# Patient Record
Sex: Male | Born: 2000 | Race: White | Hispanic: No | Marital: Single | Smoking: Never smoker
Health system: Southern US, Community
[De-identification: ages and names within clinical notes are randomized; demographics above are authoritative.]

---

## 2019-09-03 ENCOUNTER — Emergency Department (HOSPITAL_COMMUNITY): Payer: Medicaid Other

## 2019-09-03 ENCOUNTER — Other Ambulatory Visit: Payer: Self-pay

## 2019-09-03 ENCOUNTER — Emergency Department (HOSPITAL_COMMUNITY)
Admission: EM | Admit: 2019-09-03 | Discharge: 2019-09-03 | Disposition: A | Discharge status: Home / Self Care | Payer: Medicaid Other | Attending: Emergency Medicine | Admitting: Emergency Medicine

## 2019-09-03 ENCOUNTER — Encounter (HOSPITAL_COMMUNITY): Payer: Self-pay | Admitting: Emergency Medicine

## 2019-09-03 DIAGNOSIS — M79604 Pain in right leg: Secondary | ICD-10-CM | POA: Diagnosis present

## 2019-09-03 DIAGNOSIS — Y999 Unspecified external cause status: Secondary | ICD-10-CM | POA: Insufficient documentation

## 2019-09-03 DIAGNOSIS — Y93I9 Activity, other involving external motion: Secondary | ICD-10-CM | POA: Diagnosis not present

## 2019-09-03 DIAGNOSIS — Y92410 Unspecified street and highway as the place of occurrence of the external cause: Secondary | ICD-10-CM | POA: Diagnosis not present

## 2019-09-03 MED ORDER — MELOXICAM 7.5 MG PO TABS
7.5000 mg | ORAL_TABLET | Freq: Every day | ORAL | 0 refills | Status: AC
Start: 2019-09-03 — End: 2019-09-10

## 2019-09-03 MED ORDER — ACETAMINOPHEN 500 MG PO TABS
1000.0000 mg | ORAL_TABLET | Freq: Once | ORAL | Status: AC
  Administered 2019-09-03: 1000 mg via ORAL
  Filled 2019-09-03: qty 2

## 2019-09-03 MED ORDER — TIZANIDINE HCL 2 MG PO CAPS: 2.0000 mg | ORAL_CAPSULE | Freq: Three times a day (TID) | ORAL | 0 refills | Status: AC
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
## 2019-09-03 NOTE — Discharge Instructions (Addendum)
As discussed, your evaluation today has been largely reassuring.  But, it is important that you monitor your condition carefully, and do not hesitate to return to the ED if you develop new, or concerning changes in your condition. ? ?Otherwise, please follow-up with your physician for appropriate ongoing care. ? ?
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
## 2021-06-18 ENCOUNTER — Emergency Department (HOSPITAL_COMMUNITY)
Admission: EM | Admit: 2021-06-18 | Discharge: 2021-06-18 | Disposition: A | Discharge status: Home / Self Care | Payer: Medicaid Other | Attending: Emergency Medicine | Admitting: Emergency Medicine

## 2021-06-18 ENCOUNTER — Other Ambulatory Visit: Payer: Self-pay

## 2021-06-18 ENCOUNTER — Encounter (HOSPITAL_COMMUNITY): Payer: Self-pay | Admitting: Emergency Medicine

## 2021-06-18 DIAGNOSIS — R590 Localized enlarged lymph nodes: Secondary | ICD-10-CM | POA: Insufficient documentation

## 2021-06-18 DIAGNOSIS — J029 Acute pharyngitis, unspecified: Secondary | ICD-10-CM | POA: Diagnosis not present

## 2021-06-18 LAB — RPR: RPR Ser Ql: NONREACTIVE

## 2021-06-18 LAB — HEPATITIS PANEL, ACUTE
HCV Ab: NONREACTIVE
Hep A IgM: NONREACTIVE
Hep B C IgM: NONREACTIVE
Hepatitis B Surface Ag: NONREACTIVE

## 2021-06-18 LAB — HIV ANTIBODY (ROUTINE TESTING W REFLEX): HIV Screen 4th Generation wRfx: NONREACTIVE

## 2021-06-18 LAB — GROUP A STREP BY PCR: Group A Strep by PCR: NOT DETECTED

## 2021-06-18 MED ORDER — DEXAMETHASONE SODIUM PHOSPHATE 10 MG/ML IJ SOLN
10.0000 mg | Freq: Once | INTRAMUSCULAR | Status: AC
  Administered 2021-06-18: 10 mg via INTRAVENOUS
  Filled 2021-06-18: qty 1

## 2021-06-18 MED ORDER — SODIUM CHLORIDE 0.9 % IV BOLUS
1000.0000 mL | Freq: Once | INTRAVENOUS | Status: AC
  Administered 2021-06-18: 1000 mL via INTRAVENOUS

## 2021-06-18 MED ORDER — CLINDAMYCIN HCL 300 MG PO CAPS: 300.0000 mg | ORAL_CAPSULE | Freq: Three times a day (TID) | ORAL | 0 refills | Status: AC

## 2021-06-18 MED ORDER — KETOROLAC TROMETHAMINE 15 MG/ML IJ SOLN
15.0000 mg | Freq: Once | INTRAMUSCULAR | Status: AC
  Administered 2021-06-18: 15 mg via INTRAVENOUS
  Filled 2021-06-18: qty 1

## 2021-06-18 MED ORDER — CLINDAMYCIN PHOSPHATE 600 MG/50ML IV SOLN
600.0000 mg | Freq: Once | INTRAVENOUS | Status: AC
  Administered 2021-06-18: 600 mg via INTRAVENOUS
  Filled 2021-06-18: qty 50

## 2021-06-18 NOTE — ED Triage Notes (Signed)
Patient here for evaluation of tonsillitis that started one week ago, seen earlier this week at Urgent Care for same. Patient specifically requests to have his tonsils removed, states he went to [facility] ED earlier today and requested same. Patient alert, oriented, speaking in complete sentences.

## 2021-06-18 NOTE — ED Notes (Signed)
Pt verbalizes understanding of discharge instructions. Opportunity for questions and answers were provided. Pt discharged from the ED.   ?

## 2021-06-18 NOTE — ED Provider Notes (Signed)
[facility] EMERGENCY DEPARTMENT Provider Note   CSN: [id number] Arrival date & time: 06/18/21  0539     History  Chief Complaint  Patient presents with   Sore Throat    Monish Haliburton is a 21 y.o. male.  Patient here for sore throat.  States that he had symptoms for several days.  Was started on antibiotic yesterday.  States that he was negative for strep and COVID.  Does not know the name of the antibiotic he is supposed to be on.  He is taking antibiotics for 1 day.  Still has discomfort.  No trismus.  The history is provided by the patient.  Sore Throat This is a new problem. The current episode started more than 2 days ago. The problem occurs daily. The problem has not changed since onset.Pertinent negatives include no chest pain, no abdominal pain, no headaches and no shortness of breath. Nothing aggravates the symptoms. Nothing relieves the symptoms. He has tried nothing for the symptoms. The treatment provided no relief.      Home Medications Prior to Admission medications   Medication Sig Start Date End Date Taking? Authorizing Provider  clindamycin (CLEOCIN) 300 MG capsule Take 1 capsule (300 mg total) by mouth 3 (three) times daily for 10 days. 06/18/21 06/28/21 Yes Orrin Yurkovich, DO      Allergies    Patient has no allergy information on record.    Review of Systems   Review of Systems  Respiratory:  Negative for shortness of breath.   Cardiovascular:  Negative for chest pain.  Gastrointestinal:  Negative for abdominal pain.  Neurological:  Negative for headaches.   Physical Exam Updated Vital Signs BP 122/73    Pulse (!) 51    Temp 98.7 F (37.1 C) (Oral)    Resp 16    SpO2 96%  Physical Exam Vitals and nursing note reviewed.  Constitutional:      General: He is not in acute distress.    Appearance: He is well-developed. He is not ill-appearing.  HENT:     Head: Normocephalic and atraumatic.     Nose: No congestion.     Mouth/Throat:      Mouth: Mucous membranes are moist. No oral lesions.     Pharynx: Uvula midline. Oropharyngeal exudate and posterior oropharyngeal erythema present. No pharyngeal swelling or uvula swelling.     Tonsils: Tonsillar exudate present. No tonsillar abscesses. 1+ on the right. 0 on the left.  Eyes:     Conjunctiva/sclera: Conjunctivae normal.  Cardiovascular:     Rate and Rhythm: Normal rate and regular rhythm.     Heart sounds: No murmur heard. Pulmonary:     Effort: Pulmonary effort is normal. No respiratory distress.     Breath sounds: Normal breath sounds.  Abdominal:     Palpations: Abdomen is soft.     Tenderness: There is no abdominal tenderness.  Musculoskeletal:        General: No swelling.     Cervical back: Neck supple.  Skin:    General: Skin is warm and dry.     Capillary Refill: Capillary refill takes less than 2 seconds.  Neurological:     Mental Status: He is alert.  Psychiatric:        Mood and Affect: Mood normal.    ED Results / Procedures / Treatments   Labs (all labs ordered are listed, but only abnormal results are displayed) Labs Reviewed  GROUP A STREP BY PCR  RPR  HIV ANTIBODY (ROUTINE TESTING W REFLEX)  HEPATITIS PANEL, ACUTE  GC/CHLAMYDIA PROBE AMP ([place]) NOT AT [facility]    EKG None  Radiology No results found.  Procedures Procedures    Medications Ordered in ED Medications  clindamycin (CLEOCIN) IVPB 600 mg (0 mg Intravenous Stopped 06/18/21 1123)  dexamethasone (DECADRON) injection 10 mg (10 mg Intravenous Given 06/18/21 1032)  ketorolac (TORADOL) 15 MG/ML injection 15 mg (15 mg Intravenous Given 06/18/21 1033)  sodium chloride 0.9 % bolus 1,000 mL (0 mLs Intravenous Stopped 06/18/21 1123)    ED Course/ Medical Decision Making/ A&P                           Medical Decision Making Amount and/or Complexity of Data Reviewed Labs: ordered.  Risk Prescription drug management.   Ugochukwu Chichester is here for sore throat.   Unremarkable vitals.  No fever.  States he has been on antibiotic for 1 day.  Was seen yesterday at another emergency department.  Started on antibiotics but he does not know the name.  He appears to have exudate on his right tonsil.  No abscess.  No trismus or drooling.  There is no swelling of the tongue or submandibular space.  He has some right cervical adenopathy.  Oropharynx is otherwise unremarkable.  He states he had a negative strep and COVID test yesterday.  He is fairly uncomfortable.  Overall my suspicion is that this is likely viral pharyngitis.  But we will recheck him for strep, swab him for gonorrhea as well as tested for HIV, hepatitis, syphilis per his request.  We will give him a dose IV clindamycin, IV Decadron, IV Toradol and fluid bolus.  I have no concern for peritonsillar abscess or tonsillar abscess.  Patient feeling better after IV fluids, steroids and Toradol.  Tolerated clindamycin well.  Strep test continues to be negative.  STD testing has been sent including swab of his throat to test for gonorrhea and chlamydia.  He will follow-up these results.  Understands return precautions.  We will have him follow-up with ENT.  At this time I have no concern for abscess.  Patient did repeatedly ask me to have his tonsils taken out and tried to talk to him about how that is not clinically indicated.  I have talked to his family about this as well.  They can talk about if that would be needed with ENT outpatient.  This chart was dictated using voice recognition software.  Despite best efforts to proofread,  errors can occur which can change the documentation meaning.         Final Clinical Impression(s) / ED Diagnoses Final diagnoses:  Pharyngitis, unspecified etiology    Rx / DC Orders ED Discharge Orders          Ordered    clindamycin (CLEOCIN) 300 MG capsule  3 times daily        06/18/21 1202              Virgina Norfolk, DO 06/18/21 1206

## 2021-06-18 NOTE — Discharge Instructions (Signed)
Take antibiotic as prescribed.  Recommend 1000 mg of Tylenol every 6 hours as needed for pain.  Recommend 800 mg ibuprofen every 8 hours as needed for pain.  Please return if symptoms worsen.  Follow-up your STD testing on your MyChart.  If you are positive for gonorrhea or chlamydia please return for evaluation but you should be geting phone call about that as well.

## 2021-06-19 LAB — GC/CHLAMYDIA PROBE AMP (~~LOC~~) NOT AT ARMC
Chlamydia: NEGATIVE
Comment: NEGATIVE
Comment: NORMAL
Neisseria Gonorrhea: NEGATIVE

## 2021-07-23 ENCOUNTER — Emergency Department (HOSPITAL_COMMUNITY)
Admission: EM | Admit: 2021-07-23 | Discharge: 2021-07-23 | Disposition: A | Discharge status: Home / Self Care | Payer: Medicaid Other | Attending: Emergency Medicine | Admitting: Emergency Medicine

## 2021-07-23 ENCOUNTER — Emergency Department (HOSPITAL_COMMUNITY): Payer: Medicaid Other

## 2021-07-23 ENCOUNTER — Other Ambulatory Visit: Payer: Self-pay

## 2021-07-23 DIAGNOSIS — S0083XA Contusion of other part of head, initial encounter: Secondary | ICD-10-CM | POA: Insufficient documentation

## 2021-07-23 DIAGNOSIS — H9202 Otalgia, left ear: Secondary | ICD-10-CM | POA: Insufficient documentation

## 2021-07-23 DIAGNOSIS — S0993XA Unspecified injury of face, initial encounter: Secondary | ICD-10-CM | POA: Diagnosis present

## 2021-07-23 MED ORDER — MELOXICAM 7.5 MG PO TABS: 7.5000 mg | ORAL_TABLET | Freq: Every day | ORAL | 0 refills | Status: AC
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
## 2021-07-23 NOTE — Discharge Instructions (Addendum)
Fortunately the CT of your facial bones does not show any fractures today.  Please take the prescribed anti-inflammatory as directed.  If you continue to have significant pain in 1 week or difficulty with chewing, please follow-up with your doctor or the ENT specialist listed. ?
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
## 2022-03-06 ENCOUNTER — Other Ambulatory Visit: Payer: Self-pay

## 2022-03-06 ENCOUNTER — Encounter (HOSPITAL_BASED_OUTPATIENT_CLINIC_OR_DEPARTMENT_OTHER): Payer: Self-pay

## 2022-03-06 ENCOUNTER — Emergency Department (HOSPITAL_BASED_OUTPATIENT_CLINIC_OR_DEPARTMENT_OTHER)
Admission: EM | Admit: 2022-03-06 | Discharge: 2022-03-06 | Discharge status: Against Medical Advice | Payer: Medicaid Other | Attending: Emergency Medicine | Admitting: Emergency Medicine

## 2022-03-06 DIAGNOSIS — Z5321 Procedure and treatment not carried out due to patient leaving prior to being seen by health care provider: Secondary | ICD-10-CM | POA: Insufficient documentation

## 2022-03-06 DIAGNOSIS — L0231 Cutaneous abscess of buttock: Secondary | ICD-10-CM | POA: Diagnosis present

## 2022-03-06 NOTE — ED Triage Notes (Signed)
Pt states he has an abscess on his buttocks that he noticed at 1600 today.

## 2022-03-06 NOTE — ED Notes (Signed)
Pt demanded registration that he speak to someone about going back to a room immediately.  Triage spoke to pt.  Pt told registration and states "I am going home, I don't have time to wait"

## 2022-03-24 ENCOUNTER — Encounter (HOSPITAL_COMMUNITY): Payer: Self-pay

## 2022-03-24 ENCOUNTER — Other Ambulatory Visit: Payer: Self-pay

## 2022-03-24 ENCOUNTER — Emergency Department (HOSPITAL_COMMUNITY)
Admission: EM | Admit: 2022-03-24 | Discharge: 2022-03-24 | Disposition: A | Discharge status: Home / Self Care | Payer: Medicaid Other | Attending: Emergency Medicine | Admitting: Emergency Medicine

## 2022-03-24 DIAGNOSIS — K649 Unspecified hemorrhoids: Secondary | ICD-10-CM | POA: Diagnosis not present

## 2022-03-24 DIAGNOSIS — K6289 Other specified diseases of anus and rectum: Secondary | ICD-10-CM | POA: Diagnosis present

## 2022-03-24 DIAGNOSIS — R1084 Generalized abdominal pain: Secondary | ICD-10-CM

## 2022-03-24 LAB — COMPREHENSIVE METABOLIC PANEL
ALT: 28 U/L (ref 0–44)
AST: 27 U/L (ref 15–41)
Albumin: 4.1 g/dL (ref 3.5–5.0)
Alkaline Phosphatase: 62 U/L (ref 38–126)
Anion gap: 6 (ref 5–15)
BUN: 16 mg/dL (ref 6–20)
CO2: 30 mmol/L (ref 22–32)
Calcium: 9.3 mg/dL (ref 8.9–10.3)
Chloride: 102 mmol/L (ref 98–111)
Creatinine, Ser: 0.86 mg/dL (ref 0.61–1.24)
GFR, Estimated: >60 mL/min (ref >60)
Glucose, Bld: 95 mg/dL (ref 70–99)
Potassium: 4.5 mmol/L (ref 3.5–5.1)
Sodium: 138 mmol/L (ref 135–145)
Total Bilirubin: 0.9 mg/dL (ref 0.3–1.2)
Total Protein: 7.5 g/dL (ref 6.5–8.1)

## 2022-03-24 LAB — CBC
HCT: 44.7 % (ref 39.0–52.0)
Hemoglobin: 15 g/dL (ref 13.0–17.0)
MCH: 29.5 pg (ref 26.0–34.0)
MCHC: 33.6 g/dL (ref 30.0–36.0)
MCV: 88 fL (ref 80.0–100.0)
Platelets: 198 10*3/uL (ref 150–400)
RBC: 5.08 MIL/uL (ref 4.22–5.81)
RDW: 12.4 % (ref 11.5–15.5)
WBC: 7.1 10*3/uL (ref 4.0–10.5)
nRBC: 0 % (ref 0.0–0.2)

## 2022-03-24 LAB — LIPASE, BLOOD: Lipase: 30 U/L (ref 11–51)

## 2022-03-24 MED ORDER — LIDOCAINE-PRILOCAINE 2.5-2.5 % EX CREA: 1.0000 | TOPICAL_CREAM | CUTANEOUS | 0 refills | Status: AC | PRN

## 2022-03-24 MED ORDER — POLYETHYLENE GLYCOL 3350 17 GM/SCOOP PO POWD: 1.0000 | Freq: Once | ORAL | 0 refills | Status: AC

## 2022-03-24 NOTE — ED Provider Notes (Signed)
[facility] DEPT Provider Note   CSN: [id number] Arrival date & time: 03/24/22  [id number]     History Chief Complaint  Patient presents with   Abdominal Pain    HPI Tony Griffith is a 21 y.o. male presenting for chief complaint of rectal pain. He states that he was seen in urgent care last week diagnosed with hemorrhoids and started on some creams.  He states that the hemorrhoid happened when he bent over to drill a piece of metal.  He states that it hurts when he defecates and that he has been straining to have bowel movements.  He denies fevers or chills, nausea vomiting, syncope shortness of breath.  He is otherwise ambulatory tolerating p.o. intake.  He says that he took 1 Dulcolax earlier this week and it is already starting to improve.  However he is concerned because the symptoms have been persistent wants make sure that he does not need "surgery".  Patient's recorded medical, surgical, social, medication list and allergies were reviewed in the Snapshot window as part of the initial history.   Review of Systems   Review of Systems  Constitutional:  Negative for chills and fever.  HENT:  Negative for ear pain and sore throat.   Eyes:  Negative for pain and visual disturbance.  Respiratory:  Negative for cough and shortness of breath.   Cardiovascular:  Negative for chest pain and palpitations.  Gastrointestinal:  Positive for rectal pain. Negative for abdominal pain and vomiting.  Genitourinary:  Negative for dysuria and hematuria.  Musculoskeletal:  Negative for arthralgias and back pain.  Skin:  Negative for color change and rash.  Neurological:  Negative for seizures and syncope.  All other systems reviewed and are negative.   Physical Exam Updated Vital Signs BP 110/65 (BP Location: Left Arm)   Pulse 74   Temp 98.1 F (36.7 C) (Oral)   Resp 16   Ht 6\' 1"  (1.854 m)   Wt 78.2 kg   SpO2 98%   BMI 22.75 kg/m  Physical Exam Vitals and  nursing note reviewed.  Constitutional:      Appearance: He is well-developed.  HENT:     Head: Normocephalic and atraumatic.     Nose: No congestion or rhinorrhea.     Mouth/Throat:     Mouth: Mucous membranes are moist.     Pharynx: Oropharynx is clear. No oropharyngeal exudate.  Eyes:     Conjunctiva/sclera: Conjunctivae normal.     Pupils: Pupils are equal, round, and reactive to light.  Cardiovascular:     Rate and Rhythm: Normal rate and regular rhythm.     Heart sounds: No murmur heard. Pulmonary:     Effort: Pulmonary effort is normal. No respiratory distress.     Breath sounds: Normal breath sounds.  Abdominal:     Palpations: Abdomen is soft.     Tenderness: There is no abdominal tenderness.  Genitourinary:    Comments: Patient declined a rectal exam.  He stated that he already had 1 this week and unless I was doing surgery, he did not want to take his pants off again. Musculoskeletal:        General: No swelling, tenderness, deformity or signs of injury. Normal range of motion.     Cervical back: Neck supple. No rigidity or tenderness.  Skin:    General: Skin is warm and dry.  Neurological:     General: No focal deficit present.     Mental Status: He is  alert and oriented to person, place, and time. Mental status is at baseline.     Cranial Nerves: No cranial nerve deficit.     Motor: No weakness.      ED Course/ Medical Decision Making/ A&P    Procedures Procedures   Medications Ordered in ED Medications - No data to display  Medical Decision Making:    Tony Griffith is a 21 y.o. male who presented to the ED today with rectal pain detailed above.   Complete initial physical exam performed, notably the patient  was hemodynamically stable in no acute distress.  Unfortunately, he is not allowing a repeat rectal exam.     Reviewed and confirmed nursing documentation for past medical history, family history, social history.    Initial Assessment:    Patient's history present on his physicals and findings are most consistent with external hemorrhoid based on his description.  He is not allowing evaluation for thrombosed external hemorrhoid.  Regardless, given the improvement, clinically this does not seem consistent with to be a thrombosed hemorrhoid. Discussed medical management of this condition with the patient.  He would like to proceed with MiraLAX 3 times daily, topical creams and surgical referral for outpatient evaluation.  Given his well appearance, I believe this is reasonable.  Referral placed and patient stable for outpatient care and management at this time.  Disposition:  I have considered need for hospitalization, however, considering all of the above, I believe this patient is stable for discharge at this time.  Patient/family educated about specific return precautions for given chief complaint and symptoms.  Patient/family educated about follow-up with PCP/gen surg.     Patient/family expressed understanding of return precautions and need for follow-up. Patient spoken to regarding all imaging and laboratory results and appropriate follow up for these results. All education provided in verbal form with additional information in written form. Time was allowed for answering of patient questions. Patient discharged.    Emergency Department Medication Summary:   Medications - No data to display       Clinical Impression:  1. Generalized abdominal pain   2. Hemorrhoids, unspecified hemorrhoid type      Discharge   Final Clinical Impression(s) / ED Diagnoses Final diagnoses:  Generalized abdominal pain  Hemorrhoids, unspecified hemorrhoid type    Rx / DC Orders ED Discharge Orders          Ordered    polyethylene glycol powder (GLYCOLAX/MIRALAX) 17 GM/SCOOP powder   Once        03/24/22 1125    lidocaine-prilocaine (EMLA) cream  As needed        03/24/22 1125              Glyn Ade, MD 03/24/22  1504

## 2022-03-24 NOTE — Discharge Instructions (Addendum)
You are seen today for rectal pain.  You have a hemorrhoid.  You are going to need to follow-up with a general surgeon if you continue to have symptoms.  The most important treatment is to initiate MiraLAX 3 times daily for the next 5 to 7 days.  In the meantime, follow-up with either your primary doctor or your make an appointment with a general surgeon to be evaluated for need for operative treatment if your hemorrhoid continues to cause severe discomfort.  I prescribed a lidocaine cream which will help with the pain as needed.  Use it after a bowel movement.

## 2022-03-24 NOTE — ED Triage Notes (Signed)
Patient said his stomach hurts for a couple weeks but pain worsened at 8am today. No vomiting or diarrhea.

## 2022-08-18 IMAGING — CT CT MAXILLOFACIAL W/O CM
3 of 6 series · 15 of 47 positions shown, 18 images · non-contrast
Comparison: None.

CLINICAL DATA: Facial trauma, blunt mandible fracture.



[Series 3: maxilllofacial 2.0 hr40 3 · axial · 0.31mm/px · z∈[-237,-93]mm · 10 of 84 slices shown, 13 images]
[im 6/84  brain]
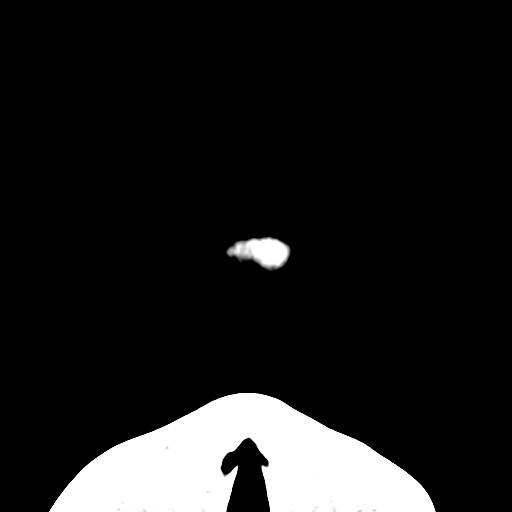
[im 6/84  bone]
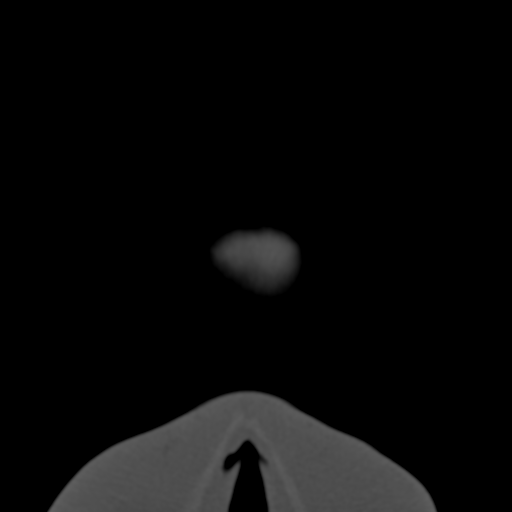
[im 12/84  bone]
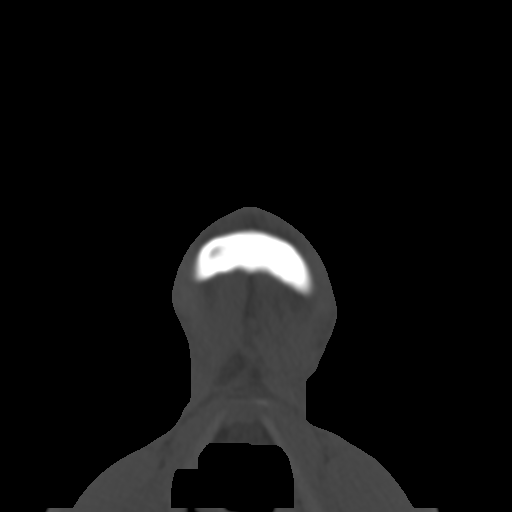
[im 24/84  bone]
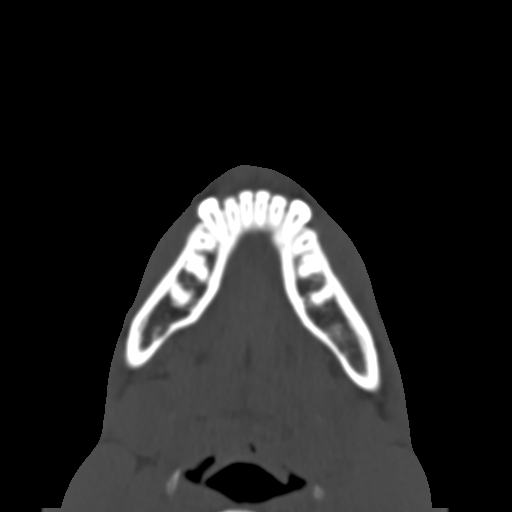
[im 30/84  bone]
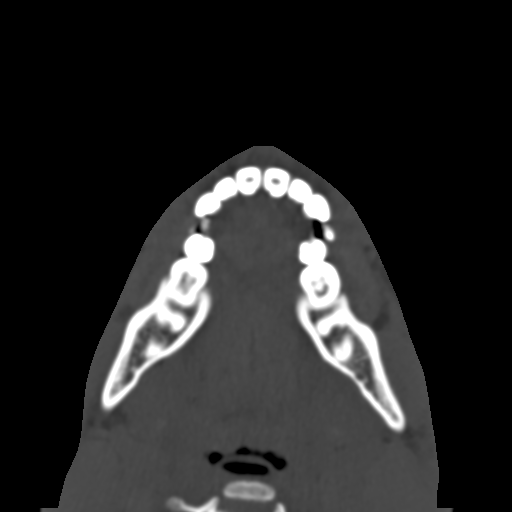
[im 36/84  brain]
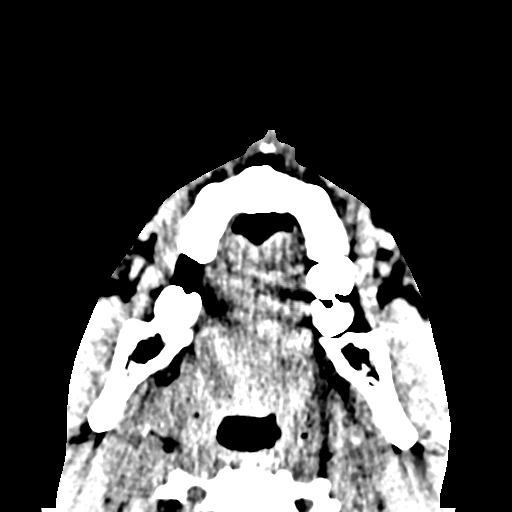
[im 36/84  bone]
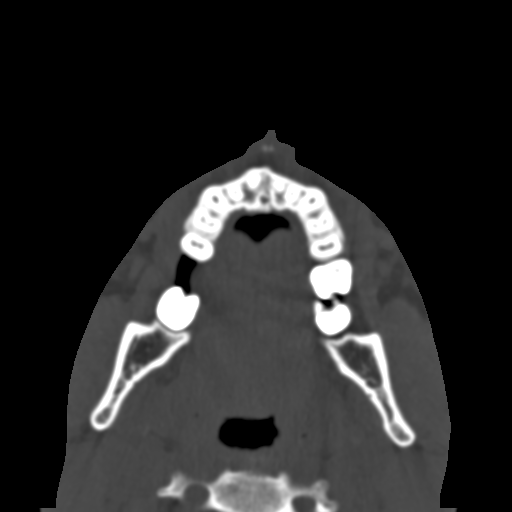
[im 48/84  bone]
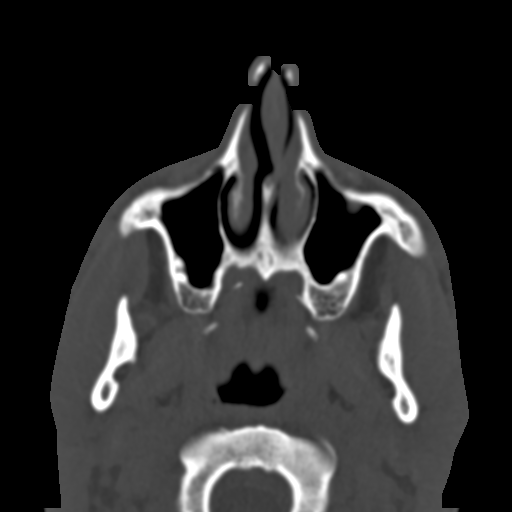
[im 54/84  bone]
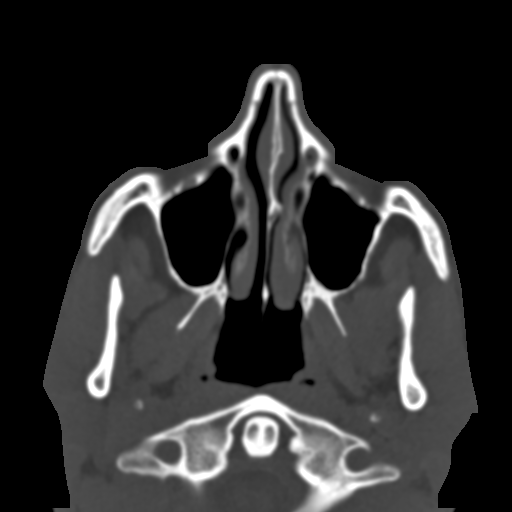
[im 60/84  bone]
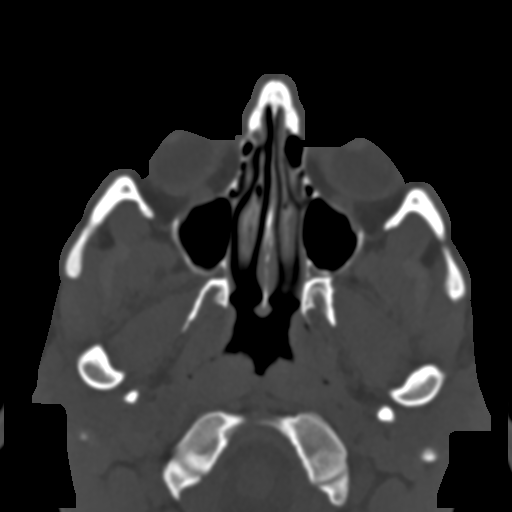
[im 72/84  brain]
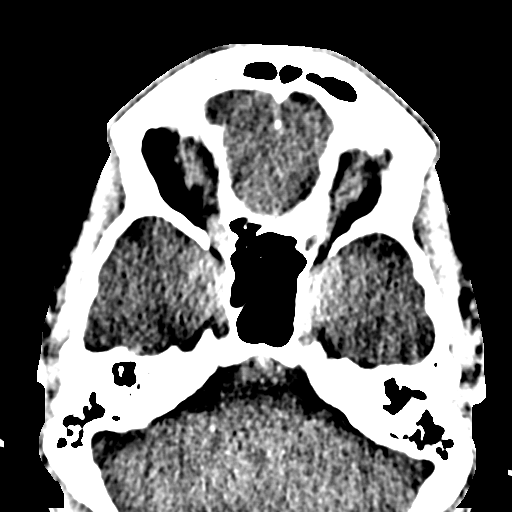
[im 72/84  bone]
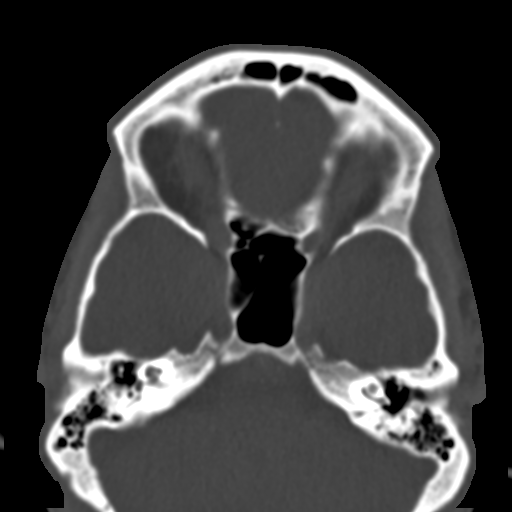
[im 78/84  bone]
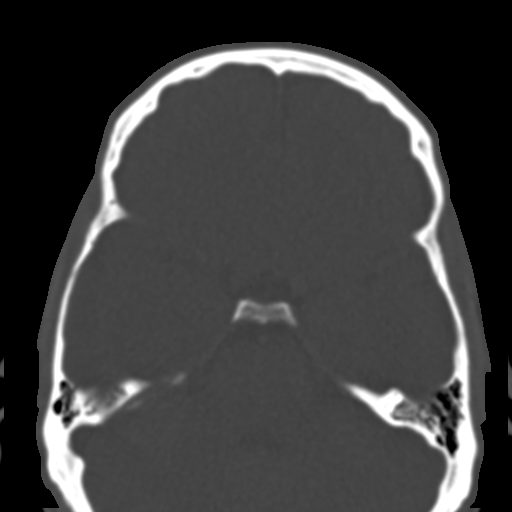

[Series 7: st cor · coronal · 0.33mm/px · 3 of 123 slices shown]
[im 31/123  bone]
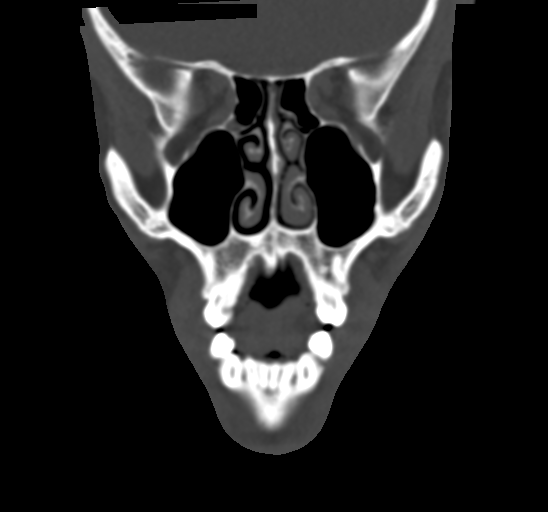
[im 62/123  bone]
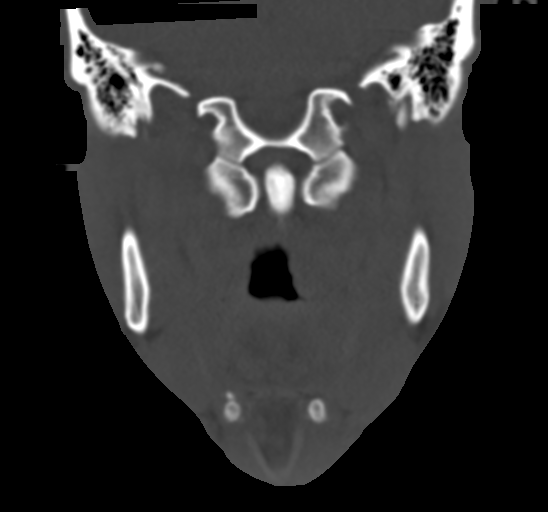
[im 92/123  bone]
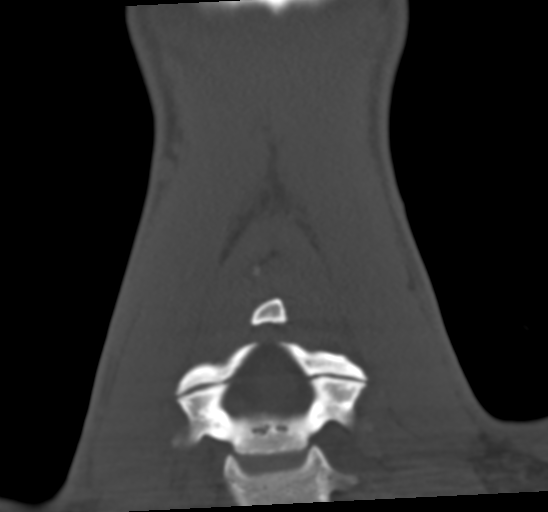

[Series 10: bone sag · sagittal · 0.33mm/px · 2 of 91 slices shown]
[im 31/91  bone]
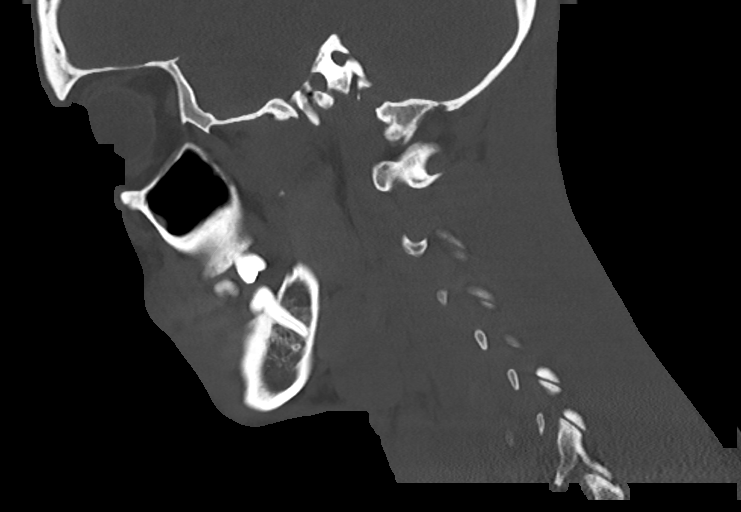
[im 61/91  bone]
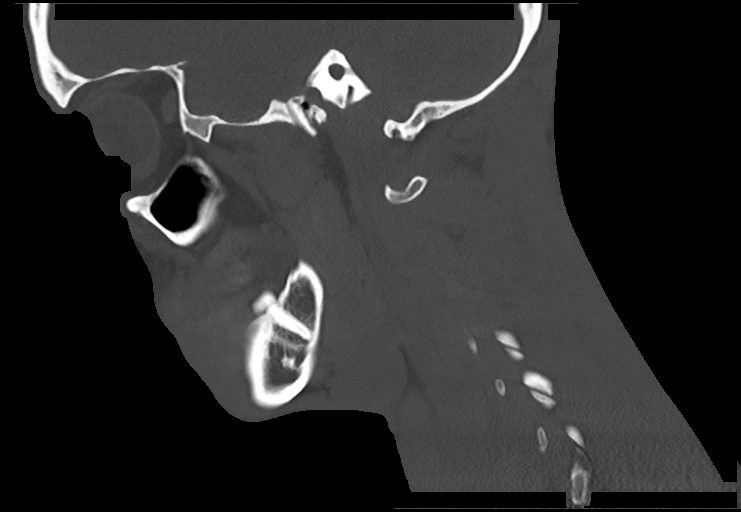

[15 of 47 positions shown; findings below may reference images not displayed]

FINDINGS: Osseous: No fracture or mandibular dislocation. No destructive
process.

Orbits: Negative. No traumatic or inflammatory finding.

Sinuses: A small round density is present in the left maxillary
sinus, likely mucosal retention cyst. No air-fluid levels are seen.

Soft tissues: No significant soft tissue hematoma.

Limited intracranial: No significant or unexpected finding.
IMPRESSION: No evidence of acute mandibular fracture or dislocation.

## 2023-11-30 ENCOUNTER — Emergency Department (HOSPITAL_COMMUNITY)
Admission: EM | Admit: 2023-11-30 | Discharge: 2023-11-30 | Disposition: A | Discharge status: Home / Self Care | Payer: Self-pay | Attending: Emergency Medicine | Admitting: Emergency Medicine

## 2023-11-30 ENCOUNTER — Emergency Department (HOSPITAL_COMMUNITY): Payer: Self-pay

## 2023-11-30 ENCOUNTER — Other Ambulatory Visit: Payer: Self-pay

## 2023-11-30 ENCOUNTER — Encounter (HOSPITAL_COMMUNITY): Payer: Self-pay

## 2023-11-30 DIAGNOSIS — R110 Nausea: Secondary | ICD-10-CM | POA: Diagnosis not present

## 2023-11-30 DIAGNOSIS — R519 Headache, unspecified: Secondary | ICD-10-CM | POA: Diagnosis present

## 2023-11-30 DIAGNOSIS — R1084 Generalized abdominal pain: Secondary | ICD-10-CM | POA: Diagnosis not present

## 2023-11-30 DIAGNOSIS — R197 Diarrhea, unspecified: Secondary | ICD-10-CM | POA: Insufficient documentation

## 2023-11-30 LAB — CBC WITH DIFFERENTIAL/PLATELET
Abs Immature Granulocytes: 0.01 K/uL (ref 0.00–0.07)
Basophils Absolute: 0 K/uL (ref 0.0–0.1)
Basophils Relative: 0 %
Eosinophils Absolute: 0 K/uL (ref 0.0–0.5)
Eosinophils Relative: 0 %
HCT: 42.3 % (ref 39.0–52.0)
Hemoglobin: 14.6 g/dL (ref 13.0–17.0)
Immature Granulocytes: 0 %
Lymphocytes Relative: 18 %
Lymphs Abs: 0.9 K/uL (ref 0.7–4.0)
MCH: 28.8 pg (ref 26.0–34.0)
MCHC: 34.5 g/dL (ref 30.0–36.0)
MCV: 83.4 fL (ref 80.0–100.0)
Monocytes Absolute: 0.7 K/uL (ref 0.1–1.0)
Monocytes Relative: 14 %
Neutro Abs: 3.3 K/uL (ref 1.7–7.7)
Neutrophils Relative %: 68 %
Platelets: 137 K/uL — ABNORMAL LOW (ref 150–400)
RBC: 5.07 MIL/uL (ref 4.22–5.81)
RDW: 12.5 % (ref 11.5–15.5)
WBC: 5 K/uL (ref 4.0–10.5)
nRBC: 0 % (ref 0.0–0.2)

## 2023-11-30 LAB — COMPREHENSIVE METABOLIC PANEL WITH GFR
ALT: 18 U/L (ref 0–44)
AST: 24 U/L (ref 15–41)
Albumin: 3.6 g/dL (ref 3.5–5.0)
Alkaline Phosphatase: 49 U/L (ref 38–126)
Anion gap: 11 (ref 5–15)
BUN: 15 mg/dL (ref 6–20)
CO2: 24 mmol/L (ref 22–32)
Calcium: 8.7 mg/dL — ABNORMAL LOW (ref 8.9–10.3)
Chloride: 102 mmol/L (ref 98–111)
Creatinine, Ser: 0.73 mg/dL (ref 0.61–1.24)
GFR, Estimated: >60 mL/min (ref >60)
Glucose, Bld: 114 mg/dL — ABNORMAL HIGH (ref 70–99)
Potassium: 4 mmol/L (ref 3.5–5.1)
Sodium: 137 mmol/L (ref 135–145)
Total Bilirubin: 0.8 mg/dL (ref 0.0–1.2)
Total Protein: 6.7 g/dL (ref 6.5–8.1)

## 2023-11-30 LAB — LIPASE, BLOOD: Lipase: 38 U/L (ref 11–51)

## 2023-11-30 MED ORDER — ONDANSETRON 4 MG PO TBDP: 4.0000 mg | ORAL_TABLET | Freq: Three times a day (TID) | ORAL | 0 refills | Status: AC | PRN

## 2023-11-30 MED ORDER — DIPHENHYDRAMINE HCL 50 MG/ML IJ SOLN
25.0000 mg | Freq: Once | INTRAMUSCULAR | Status: AC
  Administered 2023-11-30: 25 mg via INTRAVENOUS
  Filled 2023-11-30: qty 1

## 2023-11-30 MED ORDER — KETOROLAC TROMETHAMINE 15 MG/ML IJ SOLN
15.0000 mg | Freq: Once | INTRAMUSCULAR | Status: AC
  Administered 2023-11-30: 15 mg via INTRAVENOUS
  Filled 2023-11-30: qty 1

## 2023-11-30 MED ORDER — METOCLOPRAMIDE HCL 5 MG/ML IJ SOLN
10.0000 mg | Freq: Once | INTRAMUSCULAR | Status: AC
  Administered 2023-11-30: 10 mg via INTRAVENOUS
  Filled 2023-11-30: qty 2

## 2023-11-30 NOTE — ED Provider Notes (Signed)
 [place] EMERGENCY DEPARTMENT AT [facility] Provider Note   CSN: [id number] Arrival date & time: 11/30/23  0148     Patient presents with: Headache   Tony Griffith is a 23 y.o. male.   The history is provided by the patient and medical records.  Headache Tony Griffith is a 23 y.o. male who presents to the Emergency Department complaining of headache.  He presents to the emergency department for evaluation of headache since Saturday with pain located on the front and top of his head.  Headache is constant.  He also reports feeling dizzy with standing.  He has nausea, diarrhea.  He also reports some central abdominal pain.  No dysuria, vomiting.  He attributes his symptoms to donating plasma on Saturday.  He also donated plasma on Thursday.  He has no known medical problems and takes no medications.  No prior similar symptoms.  He does not have a history of headaches.    Prior to Admission medications   Medication Sig Start Date End Date Taking? Authorizing Provider  lidocaine -prilocaine  (EMLA ) cream Apply 1 Application topically as needed. 03/24/22   Jerral Meth, MD  meloxicam  (MOBIC ) 7.5 MG tablet Take 1 tablet (7.5 mg total) by mouth daily. 07/23/21   Geiple, Joshua, PA-C    Allergies: Patient has no known allergies.    Review of Systems  Neurological:  Positive for headaches.  All other systems reviewed and are negative.   Updated Vital Signs BP (!) 142/100 (BP Location: Right Arm)   Pulse 85   Temp 98.7 F (37.1 C) (Oral)   Resp 18   Ht 6' 1 (1.854 m)   Wt 79.8 kg   SpO2 97%   BMI 23.22 kg/m   Physical Exam Vitals and nursing note reviewed.  Constitutional:      Appearance: He is well-developed.  HENT:     Head: Normocephalic and atraumatic.  Cardiovascular:     Rate and Rhythm: Normal rate and regular rhythm.     Heart sounds: No murmur heard. Pulmonary:     Effort: Pulmonary effort is normal. No respiratory distress.     Breath  sounds: Normal breath sounds.  Abdominal:     Palpations: Abdomen is soft.     Tenderness: There is no guarding or rebound.     Comments: Mild generalized abdominal tenderness  Musculoskeletal:        General: No tenderness.  Skin:    General: Skin is warm and dry.  Neurological:     Mental Status: He is alert and oriented to person, place, and time.     Comments: No asymmetry of facial movements. 5/5 strength in all four extremities with sensation to light touch intact in all four extremities.   Psychiatric:        Behavior: Behavior normal.     (all labs ordered are listed, but only abnormal results are displayed) Labs Reviewed - No data to display  EKG: None  Radiology: No results found.   Procedures   Medications Ordered in the ED - No data to display                                  Medical Decision Making Amount and/or Complexity of Data Reviewed Labs: ordered. Radiology: ordered.  Risk Prescription drug management.   Patient without significant medical history here for evaluation of headache, abdominal pain and diarrhea in setting of recently giving plasma.  He is nontoxic-appearing on evaluation, no focal neurologic deficits.  Given new onset headache CT head was obtained, which is negative for acute abnormality.  Labs are unremarkable.  After treatment in the emergency department his symptoms are significantly improved.  Current picture is not consistent with subarachnoid hemorrhage, meningitis, space-occupying lesion.  In terms of his abdominal pain-symptoms are resolved on repeat assessment.  Current picture is not consistent with appendicitis, cholecystitis, diverticulitis, pancreatitis.  Discussed with patient home care for headache, diarrhea and abdominal discomfort.  Discussed outpatient follow-up and return precautions.     Final diagnoses:  None    ED Discharge Orders     None          Griselda Norris, MD 11/30/23 [phone]

## 2023-11-30 NOTE — ED Triage Notes (Signed)
 Pt complaining of a migraine since Thursday when he donated plasma. He also said he has been having diarrhea and chills since then as well.
